# Patient Record
Sex: Male | Born: 1980 | Race: Black or African American | Hispanic: No | Marital: Married
Health system: Southern US, Community
[De-identification: ages and names within clinical notes are randomized; demographics above are authoritative.]

## PROBLEM LIST (undated history)

## (undated) DIAGNOSIS — W3400XA Accidental discharge from unspecified firearms or gun, initial encounter: Secondary | ICD-10-CM

---
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
## 2002-11-08 ENCOUNTER — Emergency Department (HOSPITAL_COMMUNITY): Admission: EM | Admit: 2002-11-08 | Discharge: 2002-11-08 | Payer: Self-pay | Admitting: Emergency Medicine

## 2002-12-23 ENCOUNTER — Emergency Department (HOSPITAL_COMMUNITY): Admission: EM | Admit: 2002-12-23 | Discharge: 2002-12-23 | Payer: Self-pay | Admitting: Emergency Medicine

## 2004-01-21 ENCOUNTER — Emergency Department (HOSPITAL_COMMUNITY): Admission: EM | Admit: 2004-01-21 | Discharge: 2004-01-21 | Payer: Self-pay | Admitting: Emergency Medicine

## 2010-03-25 ENCOUNTER — Emergency Department (HOSPITAL_COMMUNITY)
Admission: EM | Admit: 2010-03-25 | Discharge: 2010-03-25 | Payer: Self-pay | Source: Home / Self Care | Admitting: Emergency Medicine

## 2012-05-22 ENCOUNTER — Emergency Department (HOSPITAL_COMMUNITY)
Admission: EM | Admit: 2012-05-22 | Discharge: 2012-05-22 | Disposition: A | Discharge status: Home / Self Care | Payer: Self-pay | Attending: Emergency Medicine | Admitting: Emergency Medicine

## 2012-05-22 ENCOUNTER — Encounter (HOSPITAL_COMMUNITY): Payer: Self-pay | Admitting: Adult Health

## 2012-05-22 ENCOUNTER — Emergency Department (HOSPITAL_COMMUNITY): Payer: Self-pay

## 2012-05-22 DIAGNOSIS — R091 Pleurisy: Secondary | ICD-10-CM | POA: Insufficient documentation

## 2012-05-22 DIAGNOSIS — R071 Chest pain on breathing: Secondary | ICD-10-CM | POA: Insufficient documentation

## 2012-05-22 DIAGNOSIS — F17200 Nicotine dependence, unspecified, uncomplicated: Secondary | ICD-10-CM | POA: Insufficient documentation

## 2012-05-22 LAB — URINALYSIS, ROUTINE W REFLEX MICROSCOPIC
Ketones, ur: 15 mg/dL — AB
Leukocytes, UA: NEGATIVE
Nitrite: NEGATIVE
Protein, ur: NEGATIVE mg/dL
pH: 5.5 (ref 5.0–8.0)

## 2012-05-22 LAB — POCT I-STAT, CHEM 8
BUN: 12 mg/dL (ref 6–23)
Calcium, Ion: 1.17 mmol/L (ref 1.12–1.23)
Chloride: 106 mEq/L (ref 96–112)
Creatinine, Ser: 1.3 mg/dL (ref 0.50–1.35)
Glucose, Bld: 83 mg/dL (ref 70–99)
Potassium: 3.3 mEq/L — ABNORMAL LOW (ref 3.5–5.1)

## 2012-05-22 LAB — CBC
MCH: 31.5 pg (ref 26.0–34.0)
RBC: 4.32 MIL/uL (ref 4.22–5.81)
RDW: 13 % (ref 11.5–15.5)

## 2012-05-22 MED ORDER — KETOROLAC TROMETHAMINE 30 MG/ML IJ SOLN
30.0000 mg | Freq: Once | INTRAMUSCULAR | Status: AC
  Administered 2012-05-22: 30 mg via INTRAVENOUS
  Filled 2012-05-22: qty 1

## 2012-05-22 MED ORDER — IBUPROFEN 600 MG PO TABS: 600.0000 mg | ORAL_TABLET | Freq: Four times a day (QID) | ORAL | Status: DC | PRN

## 2012-05-22 NOTE — ED Provider Notes (Addendum)
History     CSN: [id number]  Arrival date & time 05/22/12  0043   First MD Initiated Contact with Patient 05/22/12 0204      Chief Complaint  Patient presents with  . Flank Pain    (Consider location/radiation/quality/duration/timing/severity/associated sxs/prior treatment) Patient is a 32 y.o. male presenting with flank pain. The history is provided by the patient.  Flank Pain This is a new problem. The current episode started more than 1 week ago. The problem occurs constantly. The problem has not changed since onset.Associated symptoms include chest pain. The symptoms are aggravated by twisting, coughing and smoking. Nothing relieves the symptoms.    History reviewed. No pertinent past medical history.  History reviewed. No pertinent past surgical history.  History reviewed. No pertinent family history.  History  Substance Use Topics  . Smoking status: Current Every Day Smoker    Types: Cigarettes  . Smokeless tobacco: Not on file  . Alcohol Use: No      Review of Systems  Cardiovascular: Positive for chest pain.  Genitourinary: Positive for flank pain.  All other systems reviewed and are negative.    Allergies  Review of patient's allergies indicates no known allergies.  Home Medications  No current outpatient prescriptions on file.  BP 127/49  Pulse 88  Temp(Src) 98.1 F (36.7 C) (Oral)  Resp 16  SpO2 98%  Physical Exam  Constitutional: He is oriented to person, place, and time. He appears well-developed and well-nourished.  HENT:  Head: Normocephalic and atraumatic.  Eyes: Conjunctivae are normal. Pupils are equal, round, and reactive to light.  Neck: Normal range of motion. Neck supple.  Cardiovascular: Normal rate, regular rhythm, normal heart sounds and intact distal pulses.   Pulmonary/Chest: Effort normal and breath sounds normal.  Left chest wall tenderness to palpation  Abdominal: Soft. Bowel sounds are normal.  Neurological: He is alert  and oriented to person, place, and time.  Skin: Skin is warm and dry.  Psychiatric: He has a normal mood and affect. His behavior is normal. Judgment and thought content normal.    ED Course  Procedures (including critical care time)  Labs Reviewed  URINALYSIS, ROUTINE W REFLEX MICROSCOPIC - Abnormal; Notable for the following:    Color, Urine AMBER (*)    Bilirubin Urine SMALL (*)    Ketones, ur 15 (*)    Urobilinogen, UA 4.0 (*)    All other components within normal limits  D-DIMER, QUANTITATIVE  CBC   Dg Chest 2 View  05/22/2012  *RADIOLOGY REPORT*  Clinical Data: Flank pain.  Left upper chest pain.  CHEST - 2 VIEW  Comparison: None.  Findings: The cardiopericardial silhouette is within normal limits. Low lung volumes are present.  Bilateral basilar atelectasis.  No airspace disease.  No effusion.  IMPRESSION: Low volume chest.  No acute cardiopulmonary disease.   Original Report Authenticated By: Andreas Newport, M.D.      No diagnosis found.    MDM  Smoker chest wall pain.  Will analgesia, d-dimer,  reassess  Improved.  D-dimer neg.  Will dc to fu       Anik Wesch Lytle Michaels, MD 05/22/12 4098  Rosanne Ashing, MD 05/22/12 [phone]

## 2012-05-22 NOTE — ED Notes (Addendum)
Presents with left side pain that began 4-5 days ago worse with inspiration and cough, pain is described as sharp. Pt denies dizziness, denies chest pian, denies radiation, denies nausea. Heat makes pain better. Pt staes, "I just woke up and I hade this side pain"

## 2012-05-22 NOTE — ED Notes (Signed)
Pt to xray via stretcher with tech

## 2012-12-18 ENCOUNTER — Encounter (HOSPITAL_COMMUNITY): Payer: Self-pay | Admitting: Emergency Medicine

## 2012-12-18 ENCOUNTER — Emergency Department (HOSPITAL_COMMUNITY)
Admission: EM | Admit: 2012-12-18 | Discharge: 2012-12-18 | Disposition: A | Discharge status: Home / Self Care | Attending: Emergency Medicine | Admitting: Emergency Medicine

## 2012-12-18 ENCOUNTER — Emergency Department (HOSPITAL_COMMUNITY)

## 2012-12-18 DIAGNOSIS — F1010 Alcohol abuse, uncomplicated: Secondary | ICD-10-CM | POA: Insufficient documentation

## 2012-12-18 DIAGNOSIS — R209 Unspecified disturbances of skin sensation: Secondary | ICD-10-CM | POA: Insufficient documentation

## 2012-12-18 DIAGNOSIS — S61209A Unspecified open wound of unspecified finger without damage to nail, initial encounter: Secondary | ICD-10-CM | POA: Insufficient documentation

## 2012-12-18 DIAGNOSIS — F17200 Nicotine dependence, unspecified, uncomplicated: Secondary | ICD-10-CM | POA: Insufficient documentation

## 2012-12-18 DIAGNOSIS — Z23 Encounter for immunization: Secondary | ICD-10-CM | POA: Insufficient documentation

## 2012-12-18 DIAGNOSIS — S61219A Laceration without foreign body of unspecified finger without damage to nail, initial encounter: Secondary | ICD-10-CM

## 2012-12-18 MED ORDER — OXYCODONE-ACETAMINOPHEN 5-325 MG PO TABS: 1.0000 | ORAL_TABLET | ORAL | Status: DC | PRN

## 2012-12-18 MED ORDER — TETANUS-DIPHTH-ACELL PERTUSSIS 5-2.5-18.5 LF-MCG/0.5 IM SUSP
0.5000 mL | Freq: Once | INTRAMUSCULAR | Status: AC
  Administered 2012-12-18: 0.5 mL via INTRAMUSCULAR
  Filled 2012-12-18: qty 0.5
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
## 2012-12-23 NOTE — ED Provider Notes (Signed)
Medical screening examination/treatment/procedure(s) were performed by non-physician practitioner and as supervising physician I was immediately available for consultation/collaboration.  EKG Interpretation   None         Rolan Bucco, MD 12/23/12 1620

## 2012-12-28 ENCOUNTER — Encounter (HOSPITAL_COMMUNITY): Payer: Self-pay | Admitting: Emergency Medicine

## 2012-12-28 ENCOUNTER — Emergency Department (HOSPITAL_COMMUNITY)
Admission: EM | Admit: 2012-12-28 | Discharge: 2012-12-28 | Disposition: A | Discharge status: Home / Self Care | Attending: Emergency Medicine | Admitting: Emergency Medicine

## 2012-12-28 DIAGNOSIS — F17200 Nicotine dependence, unspecified, uncomplicated: Secondary | ICD-10-CM | POA: Insufficient documentation

## 2012-12-28 DIAGNOSIS — T8131XA Disruption of external operation (surgical) wound, not elsewhere classified, initial encounter: Secondary | ICD-10-CM | POA: Insufficient documentation

## 2012-12-28 DIAGNOSIS — Y838 Other surgical procedures as the cause of abnormal reaction of the patient, or of later complication, without mention of misadventure at the time of the procedure: Secondary | ICD-10-CM | POA: Insufficient documentation

## 2012-12-28 DIAGNOSIS — Z4802 Encounter for removal of sutures: Secondary | ICD-10-CM

## 2012-12-28 DIAGNOSIS — T148XXD Other injury of unspecified body region, subsequent encounter: Secondary | ICD-10-CM

## 2012-12-28 MED ORDER — IBUPROFEN 600 MG PO TABS: 600.0000 mg | ORAL_TABLET | Freq: Four times a day (QID) | ORAL | Status: DC | PRN

## 2012-12-28 MED ORDER — CEPHALEXIN 500 MG PO CAPS: 500.0000 mg | ORAL_CAPSULE | Freq: Four times a day (QID) | ORAL | Status: DC

## 2012-12-28 MED ORDER — CEPHALEXIN 250 MG PO CAPS
500.0000 mg | ORAL_CAPSULE | Freq: Once | ORAL | Status: AC
  Administered 2012-12-28: 500 mg via ORAL
  Filled 2012-12-28: qty 2
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
## 2012-12-28 NOTE — ED Notes (Signed)
Pt discharged home with all belongings, pt alert and ambulatory upon discharge, 2 new rx prescribed, pt verbalizes understanding of f/u instructions, pt drove himself home, no narcotics given in the ED

## 2012-12-28 NOTE — ED Provider Notes (Signed)
Medical screening examination/treatment/procedure(s) were performed by non-physician practitioner and as supervising physician I was immediately available for consultation/collaboration.  EKG Interpretation   None         David Masneri, MD 12/28/12 1724 

## 2013-05-17 ENCOUNTER — Emergency Department (HOSPITAL_COMMUNITY): Payer: Self-pay

## 2013-05-17 ENCOUNTER — Encounter (HOSPITAL_COMMUNITY): Payer: Self-pay | Admitting: Emergency Medicine

## 2013-05-17 ENCOUNTER — Emergency Department (HOSPITAL_COMMUNITY)
Admission: EM | Admit: 2013-05-17 | Discharge: 2013-05-18 | Disposition: A | Discharge status: Home / Self Care | Payer: Self-pay | Attending: Emergency Medicine | Admitting: Emergency Medicine

## 2013-05-17 ENCOUNTER — Emergency Department (HOSPITAL_COMMUNITY)

## 2013-05-17 DIAGNOSIS — S0180XA Unspecified open wound of other part of head, initial encounter: Secondary | ICD-10-CM

## 2013-05-17 DIAGNOSIS — S0181XA Laceration without foreign body of other part of head, initial encounter: Secondary | ICD-10-CM

## 2013-05-17 DIAGNOSIS — S42109B Fracture of unspecified part of scapula, unspecified shoulder, initial encounter for open fracture: Secondary | ICD-10-CM | POA: Insufficient documentation

## 2013-05-17 DIAGNOSIS — S42101B Fracture of unspecified part of scapula, right shoulder, initial encounter for open fracture: Secondary | ICD-10-CM

## 2013-05-17 DIAGNOSIS — F17200 Nicotine dependence, unspecified, uncomplicated: Secondary | ICD-10-CM | POA: Insufficient documentation

## 2013-05-17 DIAGNOSIS — W3400XA Accidental discharge from unspecified firearms or gun, initial encounter: Secondary | ICD-10-CM

## 2013-05-17 DIAGNOSIS — S27329A Contusion of lung, unspecified, initial encounter: Secondary | ICD-10-CM | POA: Insufficient documentation

## 2013-05-17 DIAGNOSIS — S21109A Unspecified open wound of unspecified front wall of thorax without penetration into thoracic cavity, initial encounter: Secondary | ICD-10-CM

## 2013-05-17 DIAGNOSIS — Z23 Encounter for immunization: Secondary | ICD-10-CM | POA: Insufficient documentation

## 2013-05-17 DIAGNOSIS — IMO0002 Reserved for concepts with insufficient information to code with codable children: Secondary | ICD-10-CM | POA: Insufficient documentation

## 2013-05-17 DIAGNOSIS — S42199B Fracture of other part of scapula, unspecified shoulder, initial encounter for open fracture: Secondary | ICD-10-CM

## 2013-05-17 LAB — CBC
HEMATOCRIT: 43.8 % (ref 39.0–52.0)
HEMOGLOBIN: 14.8 g/dL (ref 13.0–17.0)
MCH: 32.5 pg (ref 26.0–34.0)
MCHC: 33.8 g/dL (ref 30.0–36.0)
MCV: 96.3 fL (ref 78.0–100.0)
Platelets: 184 10*3/uL (ref 150–400)
RBC: 4.55 MIL/uL (ref 4.22–5.81)
RDW: 12.9 % (ref 11.5–15.5)
WBC: 9.2 10*3/uL (ref 4.0–10.5)

## 2013-05-17 LAB — COMPREHENSIVE METABOLIC PANEL
ALT: 43 U/L (ref 0–53)
AST: 42 U/L — AB (ref 0–37)
Albumin: 4.3 g/dL (ref 3.5–5.2)
Alkaline Phosphatase: 97 U/L (ref 39–117)
BUN: 8 mg/dL (ref 6–23)
CALCIUM: 9.5 mg/dL (ref 8.4–10.5)
CO2: 20 mEq/L (ref 19–32)
Chloride: 101 mEq/L (ref 96–112)
Creatinine, Ser: 1.06 mg/dL (ref 0.50–1.35)
GFR calc Af Amer: >90 mL/min (ref >90)
GFR calc non Af Amer: >90 mL/min (ref >90)
Glucose, Bld: 113 mg/dL — ABNORMAL HIGH (ref 70–99)
Potassium: 3.1 mEq/L — ABNORMAL LOW (ref 3.7–5.3)
SODIUM: 144 meq/L (ref 137–147)
TOTAL PROTEIN: 8.1 g/dL (ref 6.0–8.3)
Total Bilirubin: 0.4 mg/dL (ref 0.3–1.2)

## 2013-05-17 LAB — PREPARE FRESH FROZEN PLASMA
UNIT DIVISION: 0
Unit division: 0

## 2013-05-17 LAB — I-STAT CHEM 8, ED
BUN: 6 mg/dL (ref 6–23)
CALCIUM ION: 1.13 mmol/L (ref 1.12–1.23)
Chloride: 102 mEq/L (ref 96–112)
Creatinine, Ser: 1.5 mg/dL — ABNORMAL HIGH (ref 0.50–1.35)
Glucose, Bld: 113 mg/dL — ABNORMAL HIGH (ref 70–99)
HCT: 49 % (ref 39.0–52.0)
Hemoglobin: 16.7 g/dL (ref 13.0–17.0)
Potassium: 3 mEq/L — ABNORMAL LOW (ref 3.7–5.3)
Sodium: 143 mEq/L (ref 137–147)
TCO2: 21 mmol/L (ref 0–100)

## 2013-05-17 LAB — CDS SEROLOGY

## 2013-05-17 LAB — PROTIME-INR
INR: 1.03 (ref 0.00–1.49)
PROTHROMBIN TIME: 13.3 s (ref 11.6–15.2)

## 2013-05-17 LAB — ETHANOL: Alcohol, Ethyl (B): 197 mg/dL — ABNORMAL HIGH (ref 0–11)

## 2013-05-17 MED ORDER — AMOXICILLIN-POT CLAVULANATE 875-125 MG PO TABS: 1.0000 | ORAL_TABLET | Freq: Two times a day (BID) | ORAL | Status: AC

## 2013-05-17 MED ORDER — SODIUM CHLORIDE 0.9 % IV SOLN
1000.0000 mL | INTRAVENOUS | Status: DC
Start: 2013-05-17 — End: 2013-05-18
  Administered 2013-05-17: 1000 mL via INTRAVENOUS

## 2013-05-17 MED ORDER — AMOXICILLIN-POT CLAVULANATE 875-125 MG PO TABS
1.0000 | ORAL_TABLET | Freq: Once | ORAL | Status: AC
  Administered 2013-05-17: 1 via ORAL
  Filled 2013-05-17: qty 1

## 2013-05-17 MED ORDER — TETANUS-DIPHTHERIA TOXOIDS TD 5-2 LFU IM INJ
0.5000 mL | INJECTION | Freq: Once | INTRAMUSCULAR | Status: AC
  Administered 2013-05-17: 0.5 mL via INTRAMUSCULAR
  Filled 2013-05-17: qty 0.5

## 2013-05-17 MED ORDER — SODIUM CHLORIDE 0.9 % IV SOLN
1000.0000 mL | Freq: Once | INTRAVENOUS | Status: AC
Start: 2013-05-17 — End: 2013-05-17
  Administered 2013-05-17: 1000 mL via INTRAVENOUS

## 2013-05-17 MED ORDER — FENTANYL CITRATE 0.05 MG/ML IJ SOLN
50.0000 ug | Freq: Once | INTRAMUSCULAR | Status: AC
  Administered 2013-05-17: 50 ug via INTRAVENOUS
  Filled 2013-05-17: qty 2

## 2013-05-17 MED ORDER — HYDROCODONE-ACETAMINOPHEN 5-325 MG PO TABS
2.0000 | ORAL_TABLET | ORAL | Status: AC | PRN
Start: 2013-05-17 — End: ?

## 2013-05-17 MED ORDER — IOHEXOL 300 MG/ML  SOLN
100.0000 mL | Freq: Once | INTRAMUSCULAR | Status: AC | PRN
  Administered 2013-05-17: 100 mL via INTRAVENOUS

## 2013-05-17 MED ORDER — HYDROMORPHONE HCL PF 1 MG/ML IJ SOLN
1.0000 mg | Freq: Once | INTRAMUSCULAR | Status: AC
  Administered 2013-05-17: 1 mg via INTRAVENOUS
  Filled 2013-05-17: qty 1
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
## 2013-05-17 NOTE — ED Notes (Signed)
Patient transported to CT 
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
## 2013-05-17 NOTE — ED Notes (Signed)
Pt returned from CT °
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
## 2013-05-17 NOTE — ED Notes (Signed)
Pt placed on Non-Rebreather. Pt denies SOB, NAD.
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
## 2013-05-17 NOTE — ED Notes (Signed)
Pt given warm IV fluids.

## 2013-05-17 NOTE — ED Notes (Signed)
CSI at bedside. Dahlia ClientHannah PA informed this nurse pt does not need an RN to transport to CT.
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
## 2013-05-18 LAB — TYPE AND SCREEN
ABO/RH(D): B POS
Antibody Screen: NEGATIVE
Unit division: 0
Unit division: 0

## 2013-05-18 LAB — BLOOD PRODUCT ORDER (VERBAL) VERIFICATION

## 2013-05-18 LAB — ABO/RH: ABO/RH(D): B POS

## 2013-05-18 NOTE — ED Notes (Signed)
Dressing applied to pt's face.  Laceration clean and dried.

## 2013-05-18 NOTE — ED Provider Notes (Signed)
Medical screening examination/treatment/procedure(s) were conducted as a shared visit with non-physician practitioner(s) and myself.  I personally evaluated the patient during the encounter.  See my additional notes   EKG Interpretation None        Glynn OctaveStephen Osmani Kersten, MD 05/18/13 0025

## 2013-05-22 ENCOUNTER — Emergency Department (HOSPITAL_COMMUNITY)

## 2013-05-22 ENCOUNTER — Encounter (HOSPITAL_COMMUNITY): Payer: Self-pay | Admitting: Emergency Medicine

## 2013-05-22 ENCOUNTER — Emergency Department (HOSPITAL_COMMUNITY)
Admission: EM | Admit: 2013-05-22 | Discharge: 2013-05-22 | Disposition: A | Discharge status: Home / Self Care | Payer: Self-pay | Attending: Emergency Medicine | Admitting: Emergency Medicine

## 2013-05-22 DIAGNOSIS — R071 Chest pain on breathing: Secondary | ICD-10-CM | POA: Insufficient documentation

## 2013-05-22 DIAGNOSIS — F17200 Nicotine dependence, unspecified, uncomplicated: Secondary | ICD-10-CM | POA: Insufficient documentation

## 2013-05-22 DIAGNOSIS — Z87828 Personal history of other (healed) physical injury and trauma: Secondary | ICD-10-CM | POA: Insufficient documentation

## 2013-05-22 DIAGNOSIS — Z4802 Encounter for removal of sutures: Secondary | ICD-10-CM | POA: Insufficient documentation

## 2013-05-22 DIAGNOSIS — G8911 Acute pain due to trauma: Secondary | ICD-10-CM | POA: Insufficient documentation

## 2013-05-22 DIAGNOSIS — R0789 Other chest pain: Secondary | ICD-10-CM

## 2013-05-22 DIAGNOSIS — Z792 Long term (current) use of antibiotics: Secondary | ICD-10-CM | POA: Insufficient documentation

## 2013-05-22 HISTORY — DX: Accidental discharge from unspecified firearms or gun, initial encounter: W34.00XA

## 2013-05-22 MED ORDER — HYDROCODONE-ACETAMINOPHEN 5-325 MG PO TABS
2.0000 | ORAL_TABLET | Freq: Once | ORAL | Status: AC
  Administered 2013-05-22: 2 via ORAL
  Filled 2013-05-22: qty 2

## 2013-05-22 MED ORDER — KETOROLAC TROMETHAMINE 10 MG PO TABS
10.0000 mg | ORAL_TABLET | Freq: Once | ORAL | Status: AC
  Administered 2013-05-22: 10 mg via ORAL
  Filled 2013-05-22: qty 1

## 2013-05-22 MED ORDER — MELOXICAM 7.5 MG PO TABS: ORAL_TABLET | ORAL | Status: AC

## 2013-05-22 MED ORDER — HYDROCODONE-ACETAMINOPHEN 7.5-325 MG PO TABS: 1.0000 | ORAL_TABLET | Freq: Four times a day (QID) | ORAL | Status: AC | PRN
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
## 2013-05-22 NOTE — Discharge Instructions (Signed)
Your chest x-ray today shows some areas that you're not fully expanding your lungs. Please use the incentive spirometer 4-5 times daily. Please see the consulting physicians as instructed. Please use Mobic 2 times daily with food, may use Norco for pain if needed. This medication may cause drowsiness, please use with caution. Chest Wall Pain Chest wall pain is pain felt in or around the chest bones and muscles. It may take up to 6 weeks to get better. It may take longer if you are active. Chest wall pain can happen on its own. Other times, things like germs, injury, coughing, or exercise can cause the pain. HOME CARE   Avoid activities that make you tired or cause pain. Try not to use your chest, belly (abdominal), or side muscles. Do not use heavy weights.  Put ice on the sore area.  Put ice in a plastic bag.  Place a towel between your skin and the bag.  Leave the ice on for 15-20 minutes for the first 2 days.  Only take medicine as told by your doctor. GET HELP RIGHT AWAY IF:   You have more pain or are very uncomfortable.  You have a fever.  Your chest pain gets worse.  You have new problems.  You feel sick to your stomach (nauseous) or throw up (vomit).  You start to sweat or feel lightheaded.  You have a cough with mucus (phlegm).  You cough up blood. MAKE SURE YOU:   Understand these instructions.  Will watch your condition.  Will get help right away if you are not doing well or get worse. Document Released: 07/31/2007 Document Revised: 05/06/2011 Document Reviewed: 10/08/2010 [facility] Patient Information 2014 [place], [place].

## 2013-05-22 NOTE — ED Provider Notes (Signed)
Medical screening examination/treatment/procedure(s) were performed by non-physician practitioner and as supervising physician I was immediately available for consultation/collaboration.   EKG Interpretation None        Richardean Canalavid H Arlean Thies, MD 05/22/13 1150

## 2013-08-08 ENCOUNTER — Emergency Department (HOSPITAL_COMMUNITY)
Admission: EM | Admit: 2013-08-08 | Discharge: 2013-08-25 | Disposition: E | Attending: Emergency Medicine | Admitting: Emergency Medicine

## 2013-08-08 ENCOUNTER — Encounter (HOSPITAL_COMMUNITY): Payer: Self-pay | Admitting: Emergency Medicine

## 2013-08-08 DIAGNOSIS — S41009A Unspecified open wound of unspecified shoulder, initial encounter: Secondary | ICD-10-CM | POA: Insufficient documentation

## 2013-08-08 DIAGNOSIS — I499 Cardiac arrhythmia, unspecified: Secondary | ICD-10-CM | POA: Insufficient documentation

## 2013-08-08 DIAGNOSIS — W3400XA Accidental discharge from unspecified firearms or gun, initial encounter: Secondary | ICD-10-CM | POA: Insufficient documentation

## 2013-08-08 DIAGNOSIS — I469 Cardiac arrest, cause unspecified: Secondary | ICD-10-CM

## 2013-08-08 DIAGNOSIS — S31109A Unspecified open wound of abdominal wall, unspecified quadrant without penetration into peritoneal cavity, initial encounter: Secondary | ICD-10-CM | POA: Insufficient documentation

## 2013-08-08 DIAGNOSIS — S71109A Unspecified open wound, unspecified thigh, initial encounter: Secondary | ICD-10-CM | POA: Insufficient documentation

## 2013-08-08 DIAGNOSIS — Y9389 Activity, other specified: Secondary | ICD-10-CM | POA: Insufficient documentation

## 2013-08-08 DIAGNOSIS — R404 Transient alteration of awareness: Secondary | ICD-10-CM | POA: Insufficient documentation

## 2013-08-08 DIAGNOSIS — S71009A Unspecified open wound, unspecified hip, initial encounter: Secondary | ICD-10-CM | POA: Insufficient documentation

## 2013-08-08 DIAGNOSIS — T07XXXA Unspecified multiple injuries, initial encounter: Secondary | ICD-10-CM

## 2013-08-08 DIAGNOSIS — Y929 Unspecified place or not applicable: Secondary | ICD-10-CM | POA: Insufficient documentation
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
## 2013-08-09 ENCOUNTER — Encounter (HOSPITAL_COMMUNITY): Payer: Self-pay | Admitting: Emergency Medicine

## 2013-08-09 LAB — TYPE AND SCREEN
Unit division: 0
Unit division: 0

## 2013-08-09 LAB — PREPARE FRESH FROZEN PLASMA
UNIT DIVISION: 0
Unit division: 0

## 2013-08-09 LAB — PREPARE RBC (CROSSMATCH)

## 2013-08-09 LAB — BLOOD PRODUCT ORDER (VERBAL) VERIFICATION
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
## 2013-08-09 NOTE — ED Notes (Signed)
Dr. Dwain SarnaWakefield at bedside to attempt femoral central line.

## 2013-08-25 DEATH — deceased

## 2013-10-01 NOTE — ED Provider Notes (Signed)
Medical screening examination/treatment/procedure(s) were conducted as a shared visit with non-physician practitioner(s) or resident  and myself.  I personally evaluated the patient during the encounter and agree with the findings.   I have personally reviewed any xrays and/ or EKG's with the provider and I agree with interpretation.   See separate note.   Enid SkeensJoshua M Chloee Tena, MD 10/01/13 [phone]
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
